# Patient Record
Sex: Male | Born: 1985 | Race: Black or African American | Hispanic: No | Marital: Single | Smoking: Current every day smoker
Health system: Southern US, Community
[De-identification: ages and names within clinical notes are randomized; demographics above are authoritative.]

---

## 2016-05-06 ENCOUNTER — Encounter (HOSPITAL_COMMUNITY): Payer: Self-pay | Admitting: *Deleted

## 2016-05-06 ENCOUNTER — Emergency Department (HOSPITAL_COMMUNITY)
Admission: EM | Admit: 2016-05-06 | Discharge: 2016-05-06 | Disposition: A | Discharge status: Home / Self Care | Payer: Self-pay | Attending: Emergency Medicine | Admitting: Emergency Medicine

## 2016-05-06 ENCOUNTER — Emergency Department (HOSPITAL_COMMUNITY): Payer: Self-pay

## 2016-05-06 DIAGNOSIS — I889 Nonspecific lymphadenitis, unspecified: Secondary | ICD-10-CM | POA: Insufficient documentation

## 2016-05-06 DIAGNOSIS — F17200 Nicotine dependence, unspecified, uncomplicated: Secondary | ICD-10-CM | POA: Insufficient documentation

## 2016-05-06 LAB — I-STAT TROPONIN, ED: Troponin i, poc: 0 ng/mL (ref 0.00–0.08)

## 2016-05-06 LAB — I-STAT CHEM 8, ED
BUN: 12 mg/dL (ref 6–20)
CHLORIDE: 104 mmol/L (ref 101–111)
Calcium, Ion: 1.25 mmol/L (ref 1.15–1.40)
Creatinine, Ser: 0.9 mg/dL (ref 0.61–1.24)
Glucose, Bld: 92 mg/dL (ref 65–99)
HEMATOCRIT: 43 % (ref 39.0–52.0)
Hemoglobin: 14.6 g/dL (ref 13.0–17.0)
POTASSIUM: 3.8 mmol/L (ref 3.5–5.1)
SODIUM: 142 mmol/L (ref 135–145)
TCO2: 30 mmol/L (ref 0–100)

## 2016-05-06 MED ORDER — IOPAMIDOL (ISOVUE-300) INJECTION 61%
INTRAVENOUS | Status: AC
  Administered 2016-05-06: 75 mL
  Filled 2016-05-06: qty 75

## 2016-05-06 MED ORDER — NAPROXEN 500 MG PO TABS: 500.0000 mg | ORAL_TABLET | Freq: Two times a day (BID) | ORAL | 0 refills | Status: AC

## 2016-05-06 NOTE — Discharge Instructions (Signed)
°  All the results in the ER are normal, labs and imaging.  Please take the meds as prescribed.

## 2016-05-06 NOTE — ED Notes (Signed)
Pt. Being discharged to home accompanied by significant other.pt stable.

## 2016-05-06 NOTE — ED Notes (Signed)
Patient transported to CT 

## 2016-05-06 NOTE — ED Notes (Addendum)
Pt. Reports pain of 10 out of 10. Pt. Resting in bed with male friend.

## 2016-05-06 NOTE — ED Notes (Signed)
Pt. Returned from CT.

## 2016-05-06 NOTE — ED Triage Notes (Signed)
The pt has a swollen area to his rt neck  He  Thinks has been there for 3 days  His pain increases with movement minimal pain with swallowing

## 2016-05-06 NOTE — ED Provider Notes (Signed)
Dictation #1 [id number]  [id number]  [facility]ENCY DEPT Provider Note   CSN: [id number] Arrival date & time: 05/06/16  0410     History   Chief Complaint Chief Complaint  Patient presents with  . Oral Swelling    HPI Karl Walters Tolin is a 30 y.o. male.  HPI  Karl Walters comes in with cc of neck pain. Karl Walters is a healthy male. He reports that he started having neck pain 3 days ago. Initially, he chalked it off to sore throat, but with time he realized that he actually had pain in his neck, and that ir was worse with palpation and movement. Overtime, Karl Walters's pain and swelling has increased. He has pain with speaking, swallowing and opening mouth. Denies any dib or drooling.  History reviewed. No pertinent past medical history.  There are no active problems to display for this patient.   History reviewed. No pertinent surgical history.     Home Medications    Prior to Admission medications   Not on File    Family History No family history on file.  Social History Social History  Substance Use Topics  . Smoking status: Current Every Day Smoker  . Smokeless tobacco: Never Used  . Alcohol use Yes     Allergies   Review of patient's allergies indicates not on file.   Review of Systems Review of Systems  Constitutional: Negative for activity change, appetite change and fever.  HENT: Negative for congestion, dental problem, drooling, sore throat, trouble swallowing and voice change.   Respiratory: Negative for cough and shortness of breath.   Cardiovascular: Negative for chest pain.    ROS    Physical Exam Updated Vital Signs BP 133/89 (BP Location: Left Arm)   Pulse 77   Temp 98.8 F (37.1 C) (Oral)   Resp 14   Wt 160 lb (72.6 kg)   SpO2 99%   Physical Exam  Constitutional: He is oriented to person, place, and time. He appears well-developed.  HENT:  Head: Atraumatic.  Mouth/Throat: No oropharyngeal exudate.  No trismus  Neck: Neck supple.    Cardiovascular: Normal rate.   Pulmonary/Chest: Effort normal.  Lymphadenopathy:    He has cervical adenopathy.  Neurological: He is alert and oriented to person, place, and time.  Skin: Skin is warm.  Nursing note and vitals reviewed.    ED Treatments / Results  Labs (all labs ordered are listed, but only abnormal results are displayed) Labs Reviewed  I-STAT CHEM 8, ED  I-STAT TROPOININ, ED    EKG  EKG Interpretation None       Radiology No results found.  Procedures Procedures (including critical care time)  Medications Ordered in ED Medications - No data to display   Initial Impression / Assessment and Plan / ED Course  I have reviewed the triage vital signs and the nursing notes.  Pertinent labs & imaging results that were available during my care of the patient were reviewed by me and considered in my medical decision making (see chart for details).  Clinical Course   Karl Walters with neck pain. Likely cervical lymphadenitis. Advised nsaids and f/u with pcp. Karl Walters reports that he is very concerned, as the pain is increasing and he has no pcp. We will get CT soft tissue neck, if neg he will be discharged.   Final Clinical Impressions(s) / ED Diagnoses   Final diagnoses:  Lymphadenitis    New Prescriptions New Prescriptions   No medications on file     Samiyyah Moffa,  MD 05/06/16 [id number]

## 2017-04-26 IMAGING — CT CT NECK W/ CM
4 of 5 series · 16 of 33 positions shown, 18 images · IV contrast (Omni 300)
Comparison: None.

CLINICAL DATA: RIGHT-sided submandibular pain for 3 days.

EXAM:
CT NECK WITH CONTRAST
TECHNIQUE: Multidetector CT imaging of the neck was performed using the
standard protocol following the bolus administration of intravenous
contrast.
CONTRAST:  75mL 16OW1E-UHH IOPAMIDOL (16OW1E-UHH) INJECTION 61%

[Series 2: neck 2.0 st · axial · 0.61mm/px · z∈[+1347,+1483]mm · 4 of 114 slices shown, 5 images (1 of 3)]
[im 23/114  soft-tissue]
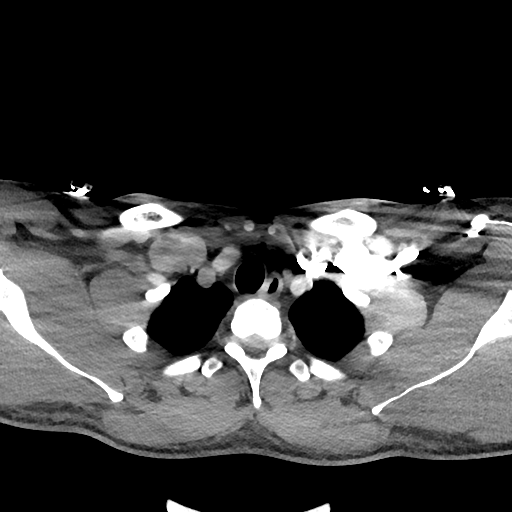
[im 23/114  bone]
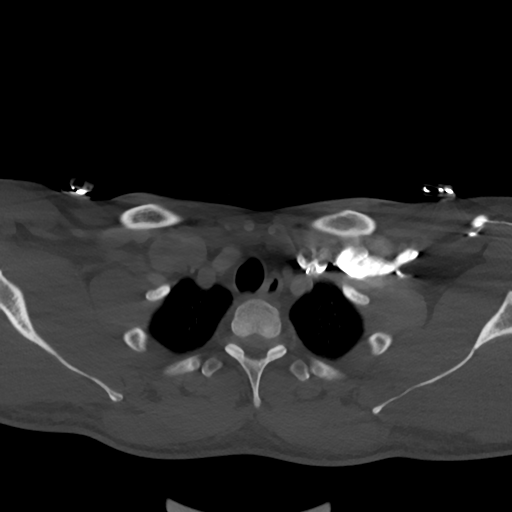
[im 46/114  bone]
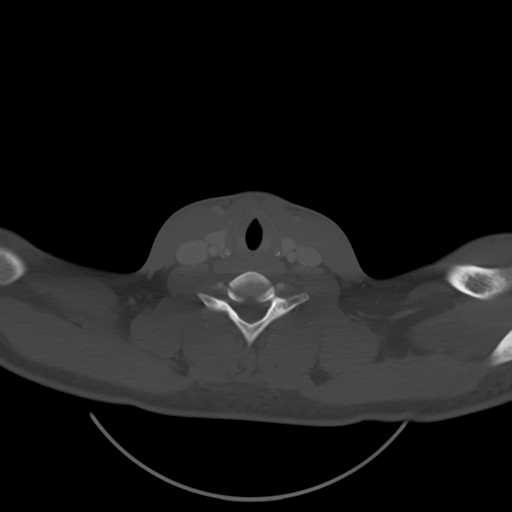
[im 68/114  bone]
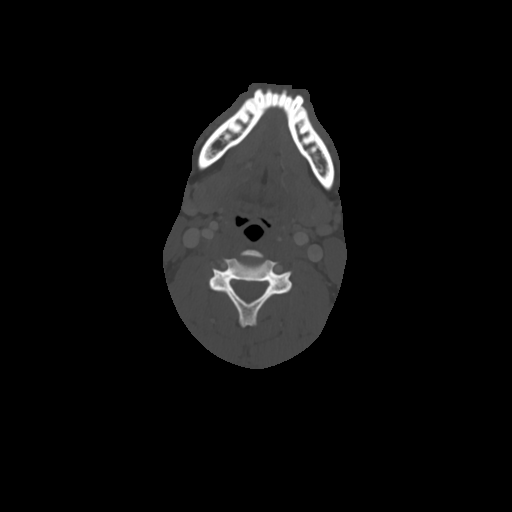
[im 91/114  bone]
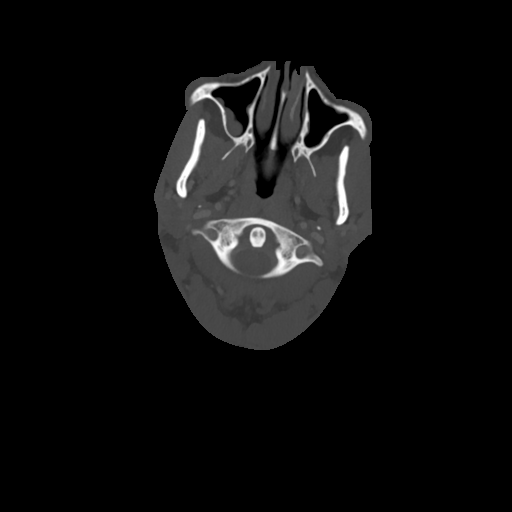

[Series 4: neck 2.0 st · sagittal · 0.49mm/px · 5 of 101 slices shown, 6 images (2 of 3)]
[im 34/101  bone]
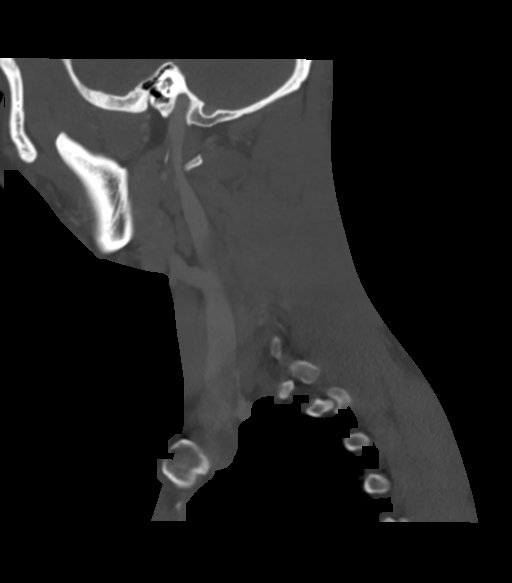
[im 42/101  bone]
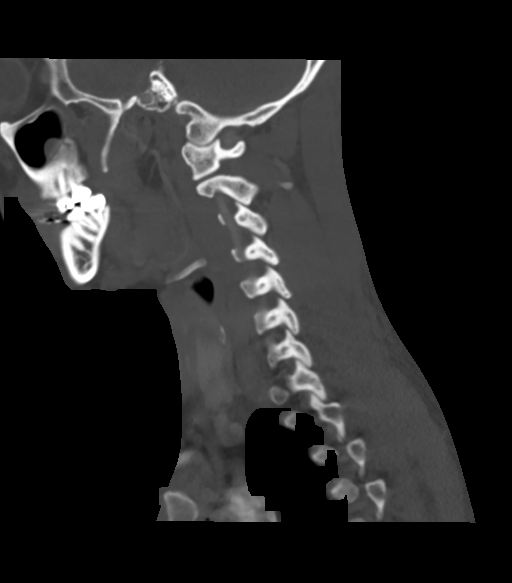
[im 51/101  soft-tissue]
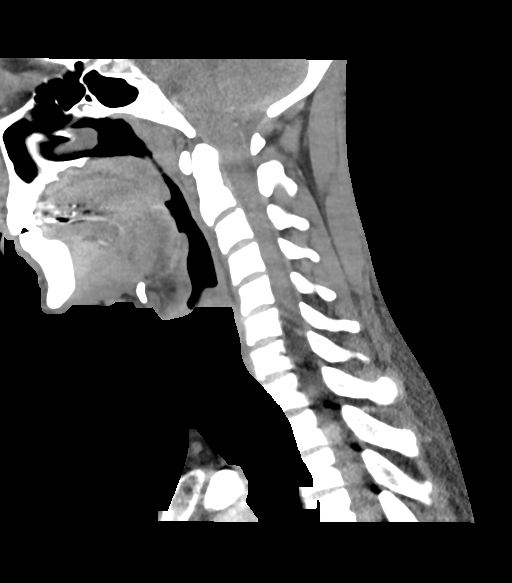
[im 51/101  bone]
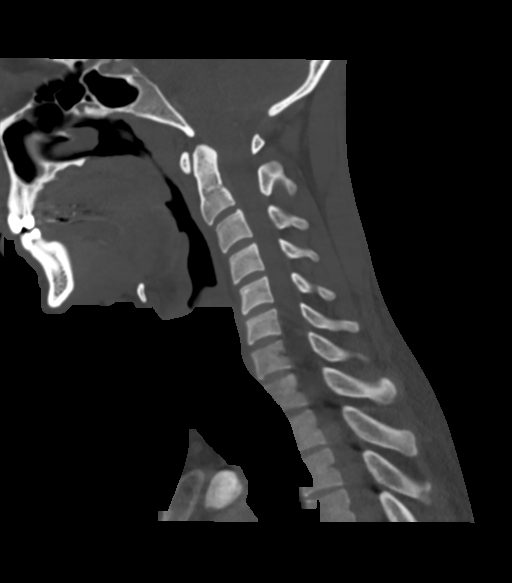
[im 59/101  bone]
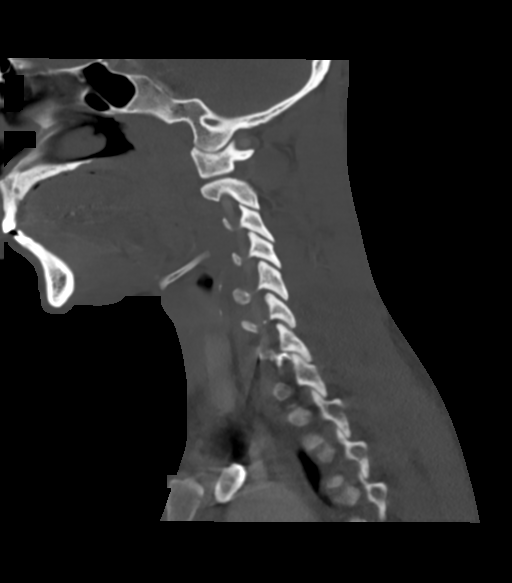
[im 67/101  bone]
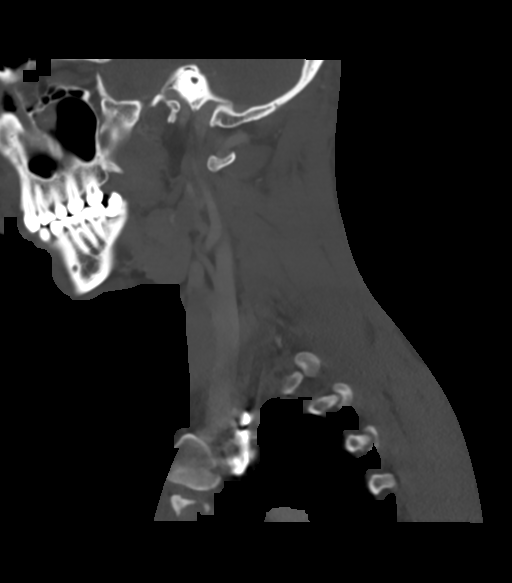

[Series 5: neck 2.0 st · coronal · 0.49mm/px · 3 of 109 slices shown (3 of 3)]
[im 22/109  bone]
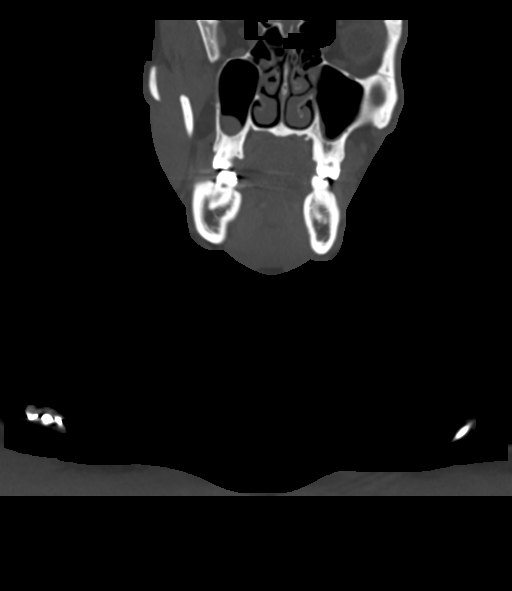
[im 44/109  bone]
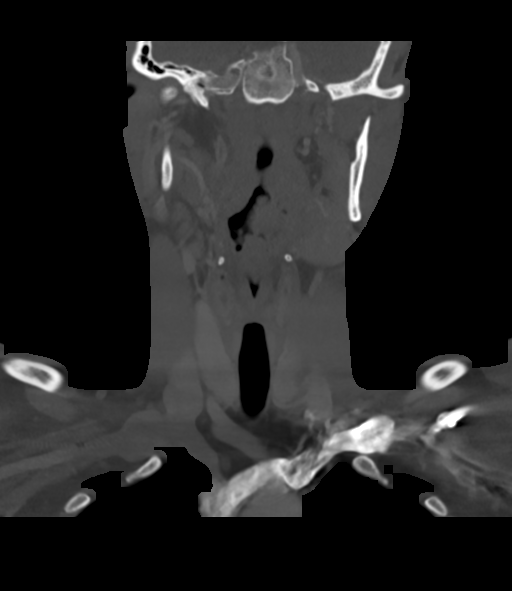
[im 65/109  bone]
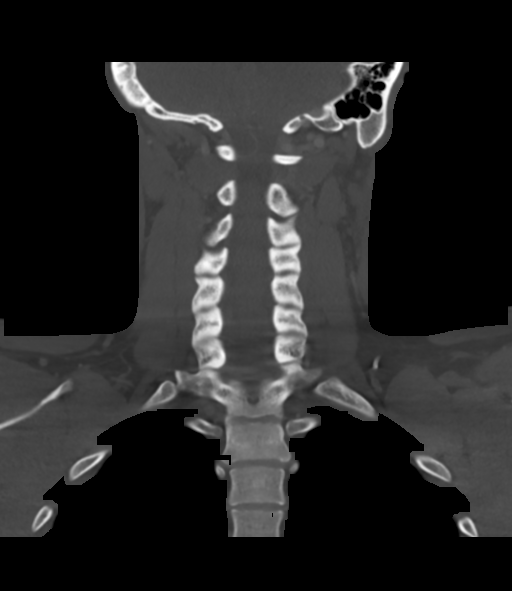

[Series 6: neck 2.0 st orthogonal · axial · 0.39mm/px · z∈[+1311,+1456]mm · 4 of 129 slices shown]
[im 26/129  bone]
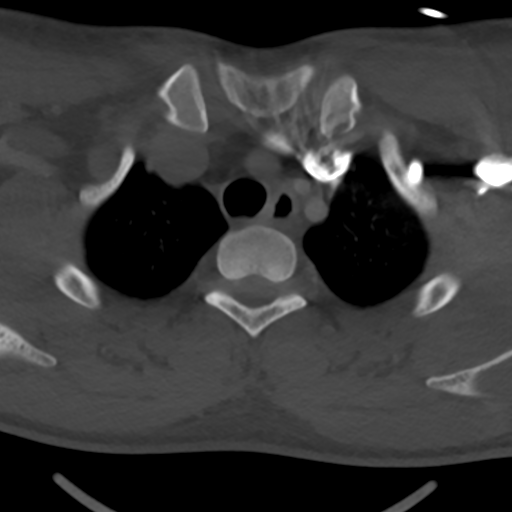
[im 52/129  bone]
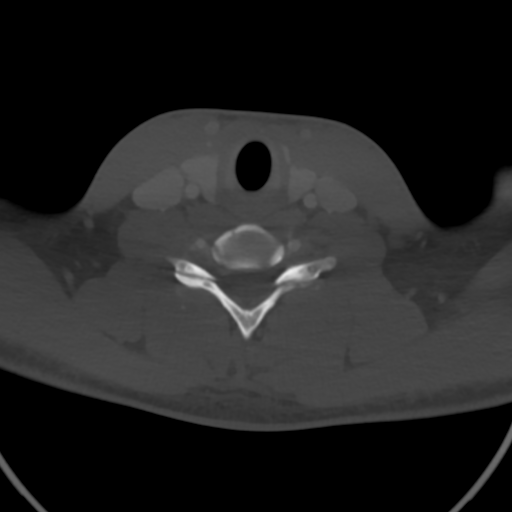
[im 77/129  bone]
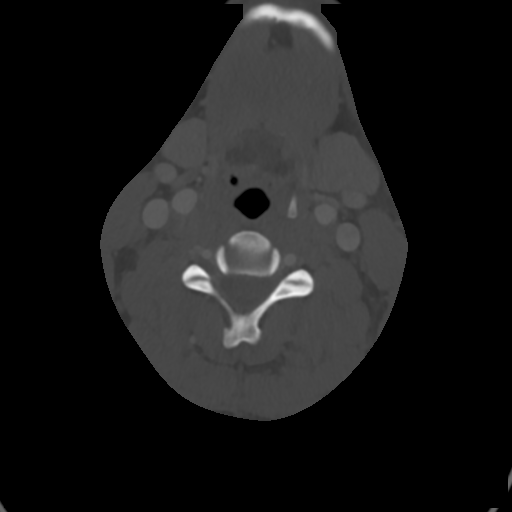
[im 103/129  bone]
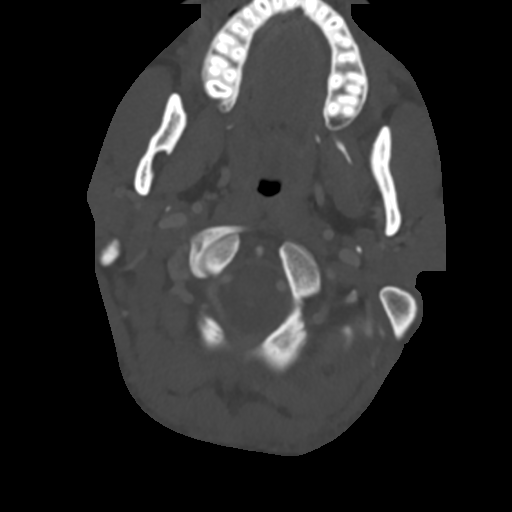

[16 of 33 positions shown; findings below may reference images not displayed]

FINDINGS: Pharynx and larynx: Normal. No mass or swelling. Mild adenoidal
hypertrophy, without signs of tonsillitis or peritonsillar
inflammation.

Salivary glands: No inflammation, mass, or stone.

Thyroid: Normal.

Lymph nodes: Mildly prominent cervical adenopathy, primarily level
II, greatest on the RIGHT at level IIA.

Vascular: Negative.

Limited intracranial: Negative.

Visualized orbits: Negative

Mastoids and visualized paranasal sinuses: Negative

Skeleton: No acute or aggressive process. No dental pathology of
significance.

Upper chest: Negative.

Other: None.
IMPRESSION: Nonspecific cervical lymph node enlargement, favored to represent
nonspecific adenitis. See discussion above.

No evidence for or submandibular gland inflammation or calculi.
